# Patient Record
Sex: Female | Born: 1975 | Race: White | Hispanic: No | Marital: Single | Smoking: Never smoker
Health system: Southern US, Community
[De-identification: ages and names within clinical notes are randomized; demographics above are authoritative.]

## PROBLEM LIST (undated history)

## (undated) DIAGNOSIS — F4310 Post-traumatic stress disorder, unspecified: Secondary | ICD-10-CM

## (undated) HISTORY — PX: NO PAST SURGERIES: SHX2092

---

## 2021-03-16 ENCOUNTER — Other Ambulatory Visit: Payer: Self-pay

## 2021-03-16 ENCOUNTER — Ambulatory Visit
Admission: EM | Admit: 2021-03-16 | Discharge: 2021-03-16 | Disposition: A | Discharge status: Home / Self Care | Payer: No Typology Code available for payment source | Attending: Sports Medicine | Admitting: Sports Medicine

## 2021-03-16 DIAGNOSIS — J029 Acute pharyngitis, unspecified: Secondary | ICD-10-CM | POA: Insufficient documentation

## 2021-03-16 DIAGNOSIS — J069 Acute upper respiratory infection, unspecified: Secondary | ICD-10-CM | POA: Insufficient documentation

## 2021-03-16 LAB — GROUP A STREP BY PCR: Group A Strep by PCR: NOT DETECTED

## 2021-03-16 MED ORDER — PROMETHAZINE-DM 6.25-15 MG/5ML PO SYRP: 5.0000 mL | ORAL_SOLUTION | Freq: Four times a day (QID) | ORAL | 0 refills | Status: DC | PRN

## 2021-03-16 MED ORDER — BENZONATATE 100 MG PO CAPS: 200.0000 mg | ORAL_CAPSULE | Freq: Three times a day (TID) | ORAL | 0 refills | Status: DC

## 2021-03-16 MED ORDER — IPRATROPIUM BROMIDE 0.06 % NA SOLN: 2.0000 | Freq: Four times a day (QID) | NASAL | 12 refills | Status: DC

## 2021-03-16 NOTE — ED Triage Notes (Signed)
Patient complains of sore throat that started around 1 week ago and has not improved. Patient states that she is concerned for strep.

## 2021-03-16 NOTE — ED Provider Notes (Signed)
[facility]    CSN: [id number] Arrival date & time: 03/16/21  1602      History   Chief Complaint Chief Complaint  Patient presents with   Sore Throat    HPI Jenny Levy is a 45 y.o. female.   HPI  45 year old female here for evaluation of sore throat.  Patient ports that she has been experiencing a sore throat for the past week along with nasal congestion with dry nasal passages, ear pain, subjective fever last week, headache, productive cough for green to white sputum, and some mild shortness of breath.  She denies any wheezing, body aches, GI complaints or, changes to her sense of taste or smell.  History reviewed. No pertinent past medical history.  There are no problems to display for this patient.   Past Surgical History:  Procedure Laterality Date   NO PAST SURGERIES      OB History   No obstetric history on file.      Home Medications    Prior to Admission medications   Medication Sig Start Date End Date Taking? Authorizing Provider  benzonatate (TESSALON) 100 MG capsule Take 2 capsules (200 mg total) by mouth every 8 (eight) hours. 03/16/21  Yes Becky Augusta, NP  ipratropium (ATROVENT) 0.06 % nasal spray Place 2 sprays into both nostrils 4 (four) times daily. 03/16/21  Yes Becky Augusta, NP  promethazine-dextromethorphan (PROMETHAZINE-DM) 6.25-15 MG/5ML syrup Take 5 mLs by mouth 4 (four) times daily as needed. 03/16/21  Yes Becky Augusta, NP    Family History Family History  Problem Relation Age of Onset   Cancer Father     Social History Social History   Tobacco Use   Smoking status: Never   Smokeless tobacco: Never  Vaping Use   Vaping Use: Never used  Substance Use Topics   Alcohol use: Never   Drug use: Never     Allergies   Amoxicillin   Review of Systems Review of Systems  Constitutional:  Positive for fever. Negative for activity change and appetite change.  HENT:  Positive for congestion, ear pain, sinus pressure  and sore throat. Negative for rhinorrhea.   Respiratory:  Positive for cough and shortness of breath. Negative for wheezing.   Gastrointestinal:  Negative for diarrhea, nausea and vomiting.  Musculoskeletal:  Negative for arthralgias and myalgias.  Skin:  Negative for rash.  Neurological:  Positive for headaches.  Hematological: Negative.   Psychiatric/Behavioral: Negative.      Physical Exam Triage Vital Signs ED Triage Vitals  Enc Vitals Group     BP 03/16/21 1638 (!) 150/99     Pulse Rate 03/16/21 1638 80     Resp 03/16/21 1638 18     Temp 03/16/21 1638 98.5 F (36.9 C)     Temp Source 03/16/21 1638 Oral     SpO2 03/16/21 1638 100 %     Weight 03/16/21 1635 278 lb (126.1 kg)     Height 03/16/21 1635 5\' 11"  (1.803 m)     Head Circumference --      Peak Flow --      Pain Score 03/16/21 1635 8     Pain Loc --      Pain Edu? --      Excl. in GC? --    No data found.  Updated Vital Signs BP (!) 150/99 (BP Location: Right Arm)   Pulse 80   Temp 98.5 F (36.9 C) (Oral)   Resp 18   Ht 5\' 11"  (  1.803 m)   Wt 278 lb (126.1 kg)   SpO2 100%   BMI 38.77 kg/m   Visual Acuity Right Eye Distance:   Left Eye Distance:   Bilateral Distance:    Right Eye Near:   Left Eye Near:    Bilateral Near:     Physical Exam Vitals and nursing note reviewed.  Constitutional:      General: She is not in acute distress.    Appearance: Normal appearance. She is well-developed. She is not ill-appearing.  HENT:     Head: Normocephalic and atraumatic.     Right Ear: Tympanic membrane, ear canal and external ear normal. There is no impacted cerumen.     Left Ear: Tympanic membrane, ear canal and external ear normal. There is no impacted cerumen.     Nose: Congestion present. No rhinorrhea.     Mouth/Throat:     Mouth: Mucous membranes are moist.     Pharynx: Oropharynx is clear. Posterior oropharyngeal erythema present.  Cardiovascular:     Rate and Rhythm: Normal rate and regular  rhythm.     Pulses: Normal pulses.     Heart sounds: Normal heart sounds. No murmur heard.   No gallop.  Pulmonary:     Effort: Pulmonary effort is normal.     Breath sounds: Normal breath sounds. No wheezing, rhonchi or rales.  Musculoskeletal:     Cervical back: Normal range of motion and neck supple.  Lymphadenopathy:     Cervical: No cervical adenopathy.  Skin:    General: Skin is warm and dry.     Capillary Refill: Capillary refill takes less than 2 seconds.     Findings: No erythema or rash.  Neurological:     General: No focal deficit present.     Mental Status: She is alert and oriented to person, place, and time.  Psychiatric:        Mood and Affect: Mood normal.        Behavior: Behavior normal.        Thought Content: Thought content normal.        Judgment: Judgment normal.     UC Treatments / Results  Labs (all labs ordered are listed, but only abnormal results are displayed) Labs Reviewed  GROUP A STREP BY PCR    EKG   Radiology No results found.  Procedures Procedures (including critical care time)  Medications Ordered in UC Medications - No data to display  Initial Impression / Assessment and Plan / UC Course  I have reviewed the triage vital signs and the nursing notes.  Pertinent labs & imaging results that were available during my care of the patient were reviewed by me and considered in my medical decision making (see chart for details).  Patient is a very pleasant, nontoxic-appearing 45 year old female here for evaluation of sore throat and respiratory symptoms that have been going on for the past week as outlined in HPI above.  Patient's physical exam reveals pearly gray tympanic membranes bilaterally with normal light reflex and mildly ceruminous external auditory canals.  Nasal mucosa is erythematous and edematous without nasal discharge.  Oropharyngeal exam reveals recessed tonsillar pillars without edema, erythema, or exudate.  Posterior  oropharynx is erythematous and injected with clear postnasal drip.  No cervical lymphadenopathy appreciated exam.  Cardiopulmonary exam is benign.  Strep PCR was collected at triage.  Strep PCR is negative.  Will discharge patient home with a diagnosis of URI.  Will provide Atrovent nasal spray to  help with nasal congestion, have patient take Tylenol and ibuprofen as needed for pain, and perform salt water gargles.  We will also give Tessalon Perles and Promethazine DM cough syrup to help with cough.  Final Clinical Impressions(s) / UC Diagnoses   Final diagnoses:  Viral URI with cough  Pharyngitis, unspecified etiology     Discharge Instructions      Use the Atrovent nasal spray, 2 squirts in each nostril every 6 hours, as needed for runny nose and postnasal drip.  Use the Tessalon Perles every 8 hours during the day.  Take them with a small sip of water.  They may give you some numbness to the base of your tongue or a metallic taste in your mouth, this is normal.  Use the Promethazine DM cough syrup at bedtime for cough and congestion.  It will make you drowsy so do not take it during the day.  Gargle with warm salt water 2-3 times a day to soothe your throat and aid in pain relief.  Return for reevaluation or see your primary care provider for any new or worsening symptoms.      ED Prescriptions     Medication Sig Dispense Auth. Provider   ipratropium (ATROVENT) 0.06 % nasal spray Place 2 sprays into both nostrils 4 (four) times daily. 15 mL Becky Augusta, NP   benzonatate (TESSALON) 100 MG capsule Take 2 capsules (200 mg total) by mouth every 8 (eight) hours. 21 capsule Becky Augusta, NP   promethazine-dextromethorphan (PROMETHAZINE-DM) 6.25-15 MG/5ML syrup Take 5 mLs by mouth 4 (four) times daily as needed. 118 mL Becky Augusta, NP      PDMP not reviewed this encounter.   Becky Augusta, NP 03/16/21 1725

## 2021-03-16 NOTE — Discharge Instructions (Addendum)
Use the Atrovent nasal spray, 2 squirts in each nostril every 6 hours, as needed for runny nose and postnasal drip.  Use the Tessalon Perles every 8 hours during the day.  Take them with a small sip of water.  They may give you some numbness to the base of your tongue or a metallic taste in your mouth, this is normal.  Use the Promethazine DM cough syrup at bedtime for cough and congestion.  It will make you drowsy so do not take it during the day.  Gargle with warm salt water 2-3 times a day to soothe your throat and aid in pain relief.  Return for reevaluation or see your primary care provider for any new or worsening symptoms.

## 2021-03-23 ENCOUNTER — Other Ambulatory Visit: Payer: Self-pay

## 2021-03-23 ENCOUNTER — Ambulatory Visit (INDEPENDENT_AMBULATORY_CARE_PROVIDER_SITE_OTHER): Payer: Non-veteran care

## 2021-03-23 ENCOUNTER — Ambulatory Visit
Admission: EM | Admit: 2021-03-23 | Discharge: 2021-03-23 | Disposition: A | Discharge status: Home / Self Care | Payer: Non-veteran care | Attending: Family Medicine | Admitting: Family Medicine

## 2021-03-23 DIAGNOSIS — M79672 Pain in left foot: Secondary | ICD-10-CM

## 2021-03-23 DIAGNOSIS — S93692A Other sprain of left foot, initial encounter: Secondary | ICD-10-CM

## 2021-03-23 HISTORY — DX: Post-traumatic stress disorder, unspecified: F43.10

## 2021-03-23 MED ORDER — MELOXICAM 15 MG PO TABS: 15.0000 mg | ORAL_TABLET | Freq: Every day | ORAL | 0 refills | Status: DC | PRN

## 2021-03-23 NOTE — ED Triage Notes (Signed)
Pt carrying 270lb yoke during training and felt L foot pop in heel area.  Swelling noted in left ankle.  Pt identifes bottom of heel and inner heel area as most painful.  Difficulty ambulating.  Took Ibuprofen and Zanaflex today.

## 2021-03-23 NOTE — Discharge Instructions (Addendum)
Rest, ice, elevation.  Medication as directed.  I recommend follow up with my colleague in [place].  Take care  Dr. Adriana Simas

## 2021-03-25 NOTE — ED Provider Notes (Signed)
[facility]    CSN: [id number] Arrival date & time: 03/23/21  1910      History   Chief Complaint Foot injury  HPI  45 year old female presents with the above complaint.  Patient reports that she was walking while carrying a 270 pound yolk during training.  She states that she felt a pop in her left heel.  She localizes the pain to the plantar aspect of the heel.  She states that she does not feel like she has injured her achilles.  She reports swelling of the heel.  She states that she is having difficulty ambulating.  Pain 8/10 in severity.  She has taken ibuprofen and Zanaflex without improvement.  No other injuries.  Denies ankle pain.  No other complaints this time.  Past Medical History:  Diagnosis Date   PTSD (post-traumatic stress disorder)    Past Surgical History:  Procedure Laterality Date   NO PAST SURGERIES     OB History   No obstetric history on file.    Home Medications    Prior to Admission medications   Medication Sig Start Date End Date Taking? Authorizing Provider  amitriptyline (ELAVIL) 10 MG tablet Take 10 mg by mouth at bedtime.   Yes [provider]  meloxicam (MOBIC) 15 MG tablet Take 1 tablet (15 mg total) by mouth daily as needed for pain. 03/23/21  Yes Tayjon Halladay G, DO  tiZANidine (ZANAFLEX) 4 MG capsule Take 4 mg by mouth 3 (three) times daily.   Yes [provider]    Family History Family History  Problem Relation Age of Onset   Cancer Father     Social History Social History   Tobacco Use   Smoking status: Never   Smokeless tobacco: Never  Vaping Use   Vaping Use: Never used  Substance Use Topics   Alcohol use: Never   Drug use: Never     Allergies   Amoxicillin and Penicillins   Review of Systems Review of Systems Per HPI  Physical Exam Triage Vital Signs ED Triage Vitals  Enc Vitals Group     BP 03/23/21 1950 (!) 142/96     Pulse Rate 03/23/21 1950 84     Resp 03/23/21 1950 18      Temp 03/23/21 1950 98.7 F (37.1 C)     Temp Source 03/23/21 1950 Oral     SpO2 03/23/21 1950 100 %     Weight --      Height --      Head Circumference --      Peak Flow --      Pain Score 03/23/21 1942 8     Pain Loc --      Pain Edu? --      Excl. in GC? --    Updated Vital Signs BP (!) 142/96 (BP Location: Right Arm)   Pulse 84   Temp 98.7 F (37.1 C) (Oral)   Resp 18   SpO2 100%   Visual Acuity Right Eye Distance:   Left Eye Distance:   Bilateral Distance:    Right Eye Near:   Left Eye Near:    Bilateral Near:     Physical Exam Constitutional:      General: She is not in acute distress.    Appearance: Normal appearance. She is obese. She is not ill-appearing.  HENT:     Head: Normocephalic and atraumatic.  Eyes:     General:  Right eye: No discharge.        Left eye: No discharge.     Conjunctiva/sclera: Conjunctivae normal.  Pulmonary:     Effort: Pulmonary effort is normal. No respiratory distress.  Musculoskeletal:     Comments: Left foot -no palpable defect or tenderness over the Achilles.  She is exquisitely tender on the plantar aspect of the heel.  Neurological:     Mental Status: She is alert.  Psychiatric:        Mood and Affect: Mood normal.        Behavior: Behavior normal.     UC Treatments / Results  Labs (all labs ordered are listed, but only abnormal results are displayed) Labs Reviewed - No data to display  EKG   Radiology DG Os Calcis Left  Result Date: 03/23/2021 CLINICAL DATA:  Heel pain following training, initial encounter EXAM: LEFT OS CALCIS - 2+ VIEW COMPARISON:  None. FINDINGS: Calcaneal spur is noted. No acute fracture is seen. Increased soft tissue edema is noted surrounding the musculotendinous junction of the Achilles suspicious for underlying injury. IMPRESSION: No acute bony abnormality is noted. Changes suspicious for Achilles injury. Correlate with clinical findings. Electronically Signed   By: Alcide Clever  M.D.   On: 03/23/2021 20:28    Procedures Procedures (including critical care time)  Medications Ordered in UC Medications - No data to display  Initial Impression / Assessment and Plan / UC Course  I have reviewed the triage vital signs and the nursing notes.  Pertinent labs & imaging results that were available during my care of the patient were reviewed by me and considered in my medical decision making (see chart for details).    45 year old female presents with suspected injury of the plantar fascia.  X-ray, os calcis obtained today.  Independently reviewed by me.  Interpretation: No appreciable fracture.  There is some soft tissue edema around the junction of the Achilles and the calcaneus.  Possible Achilles injury as well based off of imaging.  She does not have any tenderness or appreciable defects of the Achilles.  Meloxicam as directed.  Placed in a cam walker.  Advised to follow-up with sports medicine or orthopedics.  Patient declined crutches today.  Final Clinical Impressions(s) / UC Diagnoses   Final diagnoses:  Traumatic rupture of plantar fascia of left foot, initial encounter     Discharge Instructions      Rest, ice, elevation.  Medication as directed.  I recommend follow up with my colleague in [place].  Take care  Dr. Adriana Simas    ED Prescriptions     Medication Sig Dispense Auth. Provider   meloxicam (MOBIC) 15 MG tablet Take 1 tablet (15 mg total) by mouth daily as needed for pain. 30 tablet Tommie Sams, DO      PDMP not reviewed this encounter.   Tommie Sams, [place] 03/25/21 [phone]

## 2021-12-01 ENCOUNTER — Ambulatory Visit
Admission: EM | Admit: 2021-12-01 | Discharge: 2021-12-01 | Disposition: A | Discharge status: Home / Self Care | Payer: Non-veteran care | Attending: Emergency Medicine | Admitting: Emergency Medicine

## 2021-12-01 ENCOUNTER — Other Ambulatory Visit: Payer: Self-pay

## 2021-12-01 DIAGNOSIS — J069 Acute upper respiratory infection, unspecified: Secondary | ICD-10-CM

## 2021-12-01 MED ORDER — IPRATROPIUM BROMIDE 0.06 % NA SOLN: 2.0000 | Freq: Four times a day (QID) | NASAL | 12 refills | Status: DC

## 2021-12-01 MED ORDER — PROMETHAZINE-DM 6.25-15 MG/5ML PO SYRP: 5.0000 mL | ORAL_SOLUTION | Freq: Four times a day (QID) | ORAL | 0 refills | Status: DC | PRN

## 2021-12-01 MED ORDER — BENZONATATE 100 MG PO CAPS: 200.0000 mg | ORAL_CAPSULE | Freq: Three times a day (TID) | ORAL | 0 refills | Status: DC

## 2021-12-01 NOTE — ED Provider Notes (Signed)
?[facility] ? ? ? ?CSN: [id number] ?Arrival date & time: 12/01/21  0805 ? ? ?  ? ?History   ?Chief Complaint ?Chief Complaint  ?Patient presents with  ? Nasal Congestion  ? ? ?HPI ?Jenny Levy is a 46 y.o. female.  ? ?HPI ? ?46 year old female here for evaluation of respiratory complaints. ? ?Patient ports that she has been experiencing sinus pressure with clear and occasional red nasal discharge for the last 4 days.  She developed a sore throat yesterday and also a nonproductive cough.  She woke up this morning with swollen lymph nodes so she came in for evaluation.  She denies fever, sweats or chills, ear pain, shortness of breath or wheezing, or GI complaints. ? ?Past Medical History:  ?Diagnosis Date  ? PTSD (post-traumatic stress disorder)   ? ? ?There are no problems to display for this patient. ? ? ?Past Surgical History:  ?Procedure Laterality Date  ? NO PAST SURGERIES    ? ? ?OB History   ?No obstetric history on file. ?  ? ? ? ?Home Medications   ? ?Prior to Admission medications   ?Medication Sig Start Date End Date Taking? Authorizing Provider  ?amitriptyline (ELAVIL) 10 MG tablet Take 10 mg by mouth at bedtime.   Yes [provider]  ?benzonatate (TESSALON) 100 MG capsule Take 2 capsules (200 mg total) by mouth every 8 (eight) hours. 12/01/21  Yes Margarette Canada, NP  ?ipratropium (ATROVENT) 0.06 % nasal spray Place 2 sprays into both nostrils 4 (four) times daily. 12/01/21  Yes Margarette Canada, NP  ?promethazine-dextromethorphan (PROMETHAZINE-DM) 6.25-15 MG/5ML syrup Take 5 mLs by mouth 4 (four) times daily as needed. 12/01/21  Yes Margarette Canada, NP  ?tiZANidine (ZANAFLEX) 4 MG capsule Take 4 mg by mouth 3 (three) times daily.   Yes [provider]  ? ? ?Family History ?Family History  ?Problem Relation Age of Onset  ? Cancer Father   ? ? ?Social History ?Social History  ? ?Tobacco Use  ? Smoking status: Never  ? Smokeless tobacco: Never  ?Vaping Use  ? Vaping Use: Never used   ?Substance Use Topics  ? Alcohol use: Never  ? Drug use: Never  ? ? ? ?Allergies   ?Amoxicillin and Penicillins ? ? ?Review of Systems ?Review of Systems  ?Constitutional:  Negative for fever.  ?HENT:  Positive for congestion, rhinorrhea, sinus pressure and sore throat. Negative for ear pain.   ?Respiratory:  Positive for cough. Negative for shortness of breath and wheezing.   ?Gastrointestinal:  Negative for diarrhea, nausea and vomiting.  ?Musculoskeletal:  Negative for arthralgias and neck pain.  ?Skin:  Negative for rash.  ?Hematological: Negative.   ?Psychiatric/Behavioral: Negative.    ? ? ?Physical Exam ?Triage Vital Signs ?ED Triage Vitals  ?Enc Vitals Group  ?   BP 12/01/21 0819 (!) 145/99  ?   Pulse Rate 12/01/21 0819 91  ?   Resp 12/01/21 0819 18  ?   Temp 12/01/21 0819 99.4 ?F (37.4 ?C)  ?   Temp Source 12/01/21 0819 Oral  ?   SpO2 12/01/21 0819 98 %  ?   Weight 12/01/21 0819 278 lb (126.1 kg)  ?   Height 12/01/21 0819 5\' 11"  (1.803 m)  ?   Head Circumference --   ?   Peak Flow --   ?   Pain Score 12/01/21 0818 5  ?   Pain Loc --   ?   Pain Edu? --   ?  Excl. in [place]? --   ? ?No data found. ? ?Updated Vital Signs ?BP (!) 145/99 (BP Location: Right Arm)   Pulse 91   Temp 99.4 ?F (37.4 ?C) (Oral)   Resp 18   Ht 5\' 11"  (1.803 m)   Wt 278 lb (126.1 kg)   SpO2 98%   BMI 38.77 kg/m?  ? ?Visual Acuity ?Right Eye Distance:   ?Left Eye Distance:   ?Bilateral Distance:   ? ?Right Eye Near:   ?Left Eye Near:    ?Bilateral Near:    ? ?Physical Exam ?Vitals reviewed.  ?Constitutional:   ?   Appearance: Normal appearance. She is not ill-appearing.  ?HENT:  ?   Head: Normocephalic and atraumatic.  ?   Right Ear: Tympanic membrane, ear canal and external ear normal. There is no impacted cerumen.  ?   Left Ear: Tympanic membrane, ear canal and external ear normal. There is no impacted cerumen.  ?   Nose: Congestion and rhinorrhea present.  ?   Mouth/Throat:  ?   Mouth: Mucous membranes are moist.  ?   Pharynx:  Oropharynx is clear. Posterior oropharyngeal erythema present. No oropharyngeal exudate.  ?Cardiovascular:  ?   Rate and Rhythm: Normal rate and regular rhythm.  ?   Pulses: Normal pulses.  ?   Heart sounds: Normal heart sounds. No murmur heard. ?  No friction rub. No gallop.  ?Pulmonary:  ?   Effort: Pulmonary effort is normal.  ?   Breath sounds: Normal breath sounds. No wheezing, rhonchi or rales.  ?Musculoskeletal:  ?   Cervical back: Normal range of motion and neck supple.  ?Lymphadenopathy:  ?   Cervical: Cervical adenopathy present.  ?Skin: ?   General: Skin is warm and dry.  ?   Capillary Refill: Capillary refill takes less than 2 seconds.  ?   Findings: No erythema or rash.  ?Neurological:  ?   General: No focal deficit present.  ?   Mental Status: She is oriented to person, place, and time.  ?Psychiatric:     ?   Mood and Affect: Mood normal.     ?   Behavior: Behavior normal.     ?   Thought Content: Thought content normal.     ?   Judgment: Judgment normal.  ? ? ? ?UC Treatments / Results  ?Labs ?(all labs ordered are listed, but only abnormal results are displayed) ?Labs Reviewed - No data to display ? ?EKG ? ? ?Radiology ?No results found. ? ?Procedures ?Procedures (including critical care time) ? ?Medications Ordered in UC ?Medications - No data to display ? ?Initial Impression / Assessment and Plan / UC Course  ?I have reviewed the triage vital signs and the nursing notes. ? ?Pertinent labs & imaging results that were available during my care of the patient were reviewed by me and considered in my medical decision making (see chart for details). ? ?Patient is a nontoxic-appearing 46 year old female here for evaluation of respiratory complaints as outlined in HPI above.  On exam patient has pearly-gray tympanic membranes bilaterally with normal light reflex and clear external auditory canals.  Nasal mucosa is erythematous and edematous with copious clear discharge in both nares.  Oropharyngeal exam  reveals mild posterior oropharyngeal erythema and clear postnasal drip.  No exudate noted.  Patient does have bilateral anterior cervical lymphadenopathy on exam.  Cardiopulmonary exam feels clung sounds in all fields.  Patient exam is consistent with a viral URI with a cough and  I will treat her with Atrovent nasal spray to help with the nasal congestion and postnasal drip, Tessalon Perles help with cough for the daytime, and Promethazine DM cough syrup at nighttime.  I advised her that if her symptoms continue longer than 10 days or she develops fever we can reexam and the need for an antibiotic but I do not see one at this time.  Patient denies need for work note. ? ? ?Final Clinical Impressions(s) / UC Diagnoses  ? ?Final diagnoses:  ?Viral URI with cough  ? ? ? ?Discharge Instructions   ? ?  ?Use the Atrovent nasal spray, 2 squirts in each nostril every 6 hours, as needed for runny nose and postnasal drip. ? ?Use the Tessalon Perles every 8 hours during the day.  Take them with a small sip of water.  They may give you some numbness to the base of your tongue or a metallic taste in your mouth, this is normal. ? ?Use the Promethazine DM cough syrup at bedtime for cough and congestion.  It will make you drowsy so do not take it during the day. ? ?Return for reevaluation or see your primary care provider for any new or worsening symptoms.  ? ? ? ? ?ED Prescriptions   ? ? Medication Sig Dispense Auth. Provider  ? benzonatate (TESSALON) 100 MG capsule Take 2 capsules (200 mg total) by mouth every 8 (eight) hours. 21 capsule Margarette Canada, NP  ? ipratropium (ATROVENT) 0.06 % nasal spray Place 2 sprays into both nostrils 4 (four) times daily. 15 mL Margarette Canada, NP  ? promethazine-dextromethorphan (PROMETHAZINE-DM) 6.25-15 MG/5ML syrup Take 5 mLs by mouth 4 (four) times daily as needed. 118 mL Margarette Canada, NP  ? ?  ? ?PDMP not reviewed this encounter. ?  ?Margarette Canada, NP ?12/01/21 [phone] ? ?

## 2021-12-01 NOTE — Discharge Instructions (Addendum)

## 2021-12-01 NOTE — ED Triage Notes (Signed)
Pt here with C/O sinus pressure for 4 days, throat started getting sore yesterday, woke up with lymph nodes swollen.  ?

## 2021-12-28 ENCOUNTER — Ambulatory Visit
Admission: EM | Admit: 2021-12-28 | Discharge: 2021-12-28 | Disposition: A | Discharge status: Home / Self Care | Payer: Non-veteran care | Attending: Internal Medicine | Admitting: Internal Medicine

## 2021-12-28 ENCOUNTER — Encounter: Payer: Self-pay | Admitting: Emergency Medicine

## 2021-12-28 ENCOUNTER — Other Ambulatory Visit: Payer: Self-pay

## 2021-12-28 DIAGNOSIS — K529 Noninfective gastroenteritis and colitis, unspecified: Secondary | ICD-10-CM

## 2021-12-28 MED ORDER — FAMOTIDINE 20 MG PO TABS: 20.0000 mg | ORAL_TABLET | Freq: Two times a day (BID) | ORAL | 0 refills | Status: AC

## 2021-12-28 MED ORDER — ONDANSETRON HCL 4 MG PO TABS: 8.0000 mg | ORAL_TABLET | ORAL | 0 refills | Status: AC | PRN

## 2021-12-28 NOTE — Discharge Instructions (Signed)
Symptoms and exam today are consistent with a viral stomach bug.  Prescriptions for famotidine (for heartburn) and ondansetron (for nausea) were sent to the pharmacy.  Anticipate gradual improvement over the next few days; may take a couple weeks for bowel movements to return to normal.  Diet as tolerated.  Push fluids.  Rest.  Recheck or followup with your primary care provider for new fever >100.5, severe/sustained abdominal pain, persistent vomiting, or if not improving as expected. ?

## 2021-12-28 NOTE — ED Provider Notes (Signed)
?[facility] ? ? ? ?CSN: [id number] ?Arrival date & time: 12/28/21  1439 ? ? ?  ? ?History   ?Chief Complaint ?Chief Complaint  ?Patient presents with  ? Abdominal Pain  ? ? ?HPI ?Danah Reinecke is a 46 y.o. female. Sunday am 3/26 woke up with bad epigastric burning/crampy discomfort, like heartburn but she never gets heartburn.  Had onset of nausea after that and little bit of vomiting; last emesis was yesterday 3/27.  Having soft stools at markedly increased frequency from usual also.  No fever.  Not coughing, no runny/congested nose, no sore throat.  No unusual vaginal discharge or bleeding; little bit of spotting like she does for her period, which is due.  No urinary discomfort, no frequency.  Not lightheaded.  Keeping liquids down.  A close girlfriend had similar symptoms all week last week; they spent time together the weekend before last.   ? ? ?Abdominal Pain ? ?Past Medical History:  ?Diagnosis Date  ? PTSD (post-traumatic stress disorder)   ? ? ?There are no problems to display for this patient. ? ? ?Past Surgical History:  ?Procedure Laterality Date  ? NO PAST SURGERIES    ? ? ?Home Medications   ? ?Prior to Admission medications   ?Medication Sig Start Date End Date Taking? Authorizing Provider  ?famotidine (PEPCID) 20 MG tablet Take 1 tablet (20 mg total) by mouth 2 (two) times daily. 12/28/21  Yes Isa Rankin, MD  ?ondansetron [facility]) 4 MG tablet Take 2 tablets (8 mg total) by mouth every 4 (four) hours as needed for nausea or vomiting. 12/28/21  Yes Isa Rankin, MD  ?amitriptyline (ELAVIL) 10 MG tablet Take 10 mg by mouth at bedtime.    [provider]  ?tiZANidine (ZANAFLEX) 4 MG capsule Take 4 mg by mouth 3 (three) times daily.    [provider]  ? ? ?Family History ?Family History  ?Problem Relation Age of Onset  ? Cancer Father   ? ? ?Social History ?Social History  ? ?Tobacco Use  ? Smoking status: Never  ? Smokeless tobacco: Never  ?Vaping Use  ?  Vaping Use: Never used  ?Substance Use Topics  ? Alcohol use: Never  ? Drug use: Never  ? ? ? ?Allergies   ?Amoxicillin and Penicillins ? ? ?Review of Systems ?Review of Systems  ?Gastrointestinal:  Positive for abdominal pain.  Epigastric; see also HPI ? ? ?Physical Exam ?Triage Vital Signs ?ED Triage Vitals  ?Enc Vitals Group  ?   BP 12/28/21 1454 (!) 138/97  ?   Pulse Rate 12/28/21 1454 77  ?   Resp 12/28/21 1454 18  ?   Temp 12/28/21 1454 98.6 ?F (37 ?C)  ?   Temp Source 12/28/21 1454 Oral  ?   SpO2 12/28/21 1454 98 %  ?   Weight 12/28/21 1452 278 lb (126.1 kg)  ?   Height 12/28/21 1452 5\' 11"  (1.803 m)  ?   Pain Score 12/28/21 1451 8  ?   Pain Loc --   ? ? ?Updated Vital Signs ?BP (!) 138/97 (BP Location: Right Arm)   Pulse 77   Temp 98.6 ?F (37 ?C) (Oral)   Resp 18   Ht 5\' 11"  (1.803 m)   Wt 126.1 kg   SpO2 98%   BMI 38.77 kg/m?  ? ?Physical Exam ?Constitutional:   ?   General: She is not in acute distress. ?   Appearance: She is ill-appearing. She is not  toxic-appearing.  ?   Comments: Good hygiene  ?HENT:  ?   Head: Atraumatic.  ?   Mouth/Throat:  ?   Mouth: Mucous membranes are moist.  ?Eyes:  ?   Conjunctiva/sclera:  ?   Right eye: Right conjunctiva is not injected. No exudate. ?   Left eye: Left conjunctiva is not injected. No exudate. ?   Comments: Conjugate gaze observed; eyes a little watery but no frank discharge  ?Cardiovascular:  ?   Rate and Rhythm: Normal rate and regular rhythm.  ?Pulmonary:  ?   Effort: Pulmonary effort is normal. No respiratory distress.  ?   Breath sounds: No wheezing or rhonchi.  ?Abdominal:  ?   General: There is no distension.  ?   Palpations: Abdomen is soft.  ?   Tenderness: There is no guarding or rebound.  ?   Comments: Moderate tenderness to deep palpation in the epigastrium  ?Musculoskeletal:  ?   Cervical back: Neck supple.  ?   Comments: Walked into the urgent care independently  ?Skin: ?   General: Skin is warm and dry.  ?   Comments: No cyanosis   ?Neurological:  ?   Mental Status: She is alert.  ?   Comments: Face symmetric, speech clear/coherent/logical  ? ? ? ?UC Treatments / Results  ?Labs ?(all labs ordered are listed, but only abnormal results are displayed) ?Labs Reviewed - No data to display ?NA ? ?EKG ?NA ? ?Radiology ?No results found. ?NA ? ?Procedures ?Procedures (including critical care time) ?NA ? ?Medications Ordered in UC ?Medications - No data to display ?NA ? ?Final Clinical Impressions(s) / UC Diagnoses  ? ?Final diagnoses:  ?Acute gastroenteritis  ? ? ? ?Discharge Instructions   ? ?  ?Symptoms and exam today are consistent with a viral stomach bug.  Prescriptions for famotidine (for heartburn) and ondansetron (for nausea) were sent to the pharmacy.  Anticipate gradual improvement over the next few days; may take a couple weeks for bowel movements to return to normal.  Diet as tolerated.  Push fluids.  Rest.  Recheck or followup with your primary care provider for new fever >100.5, severe/sustained abdominal pain, persistent vomiting, or if not improving as expected. ? ? ?ED Prescriptions   ? ? Medication Sig Dispense Auth. Provider  ? ondansetron (ZOFRAN) 4 MG tablet Take 2 tablets (8 mg total) by mouth every 4 (four) hours as needed for nausea or vomiting. 20 tablet Isa Rankin, MD  ? famotidine (PEPCID) 20 MG tablet Take 1 tablet (20 mg total) by mouth 2 (two) times daily. 30 tablet Isa Rankin, MD  ? ?  ? ?PDMP not reviewed this encounter. ?  ?Isa Rankin, MD ?12/30/21 1107 ? ?

## 2021-12-28 NOTE — ED Triage Notes (Signed)
Pt c/o nausea, epigastric pain, vomiting and headache. Started 2 days ago. She states vomited last yesterday. Denies fever. She has tried tums, pepto without relief. Abdominal pain is intermittent. She states it feels like her stomach muscle cramps. ?

## 2022-04-29 IMAGING — CR DG OS CALCIS 2+V*L*
3 series · 3 of 3 positions shown · non-contrast
Comparison: None.

CLINICAL DATA: Heel pain following training, initial encounter

EXAM:
LEFT OS CALCIS - 2+ VIEW

[calcaneus axial (1 of 2)]
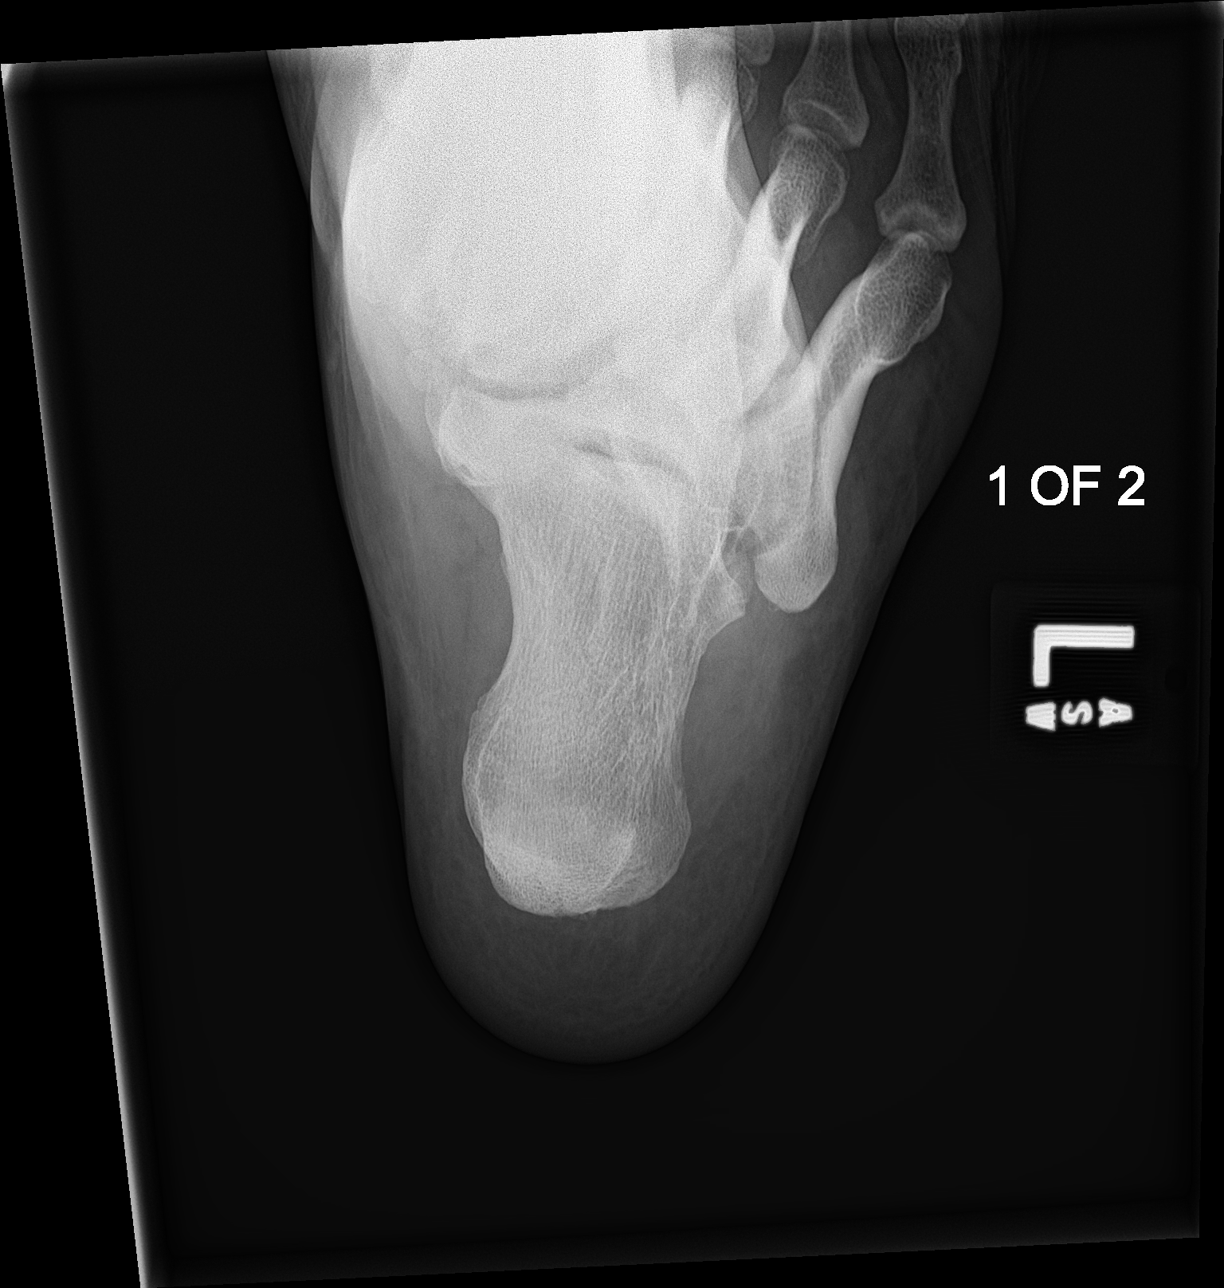

[calcaneus axial (2 of 2)]
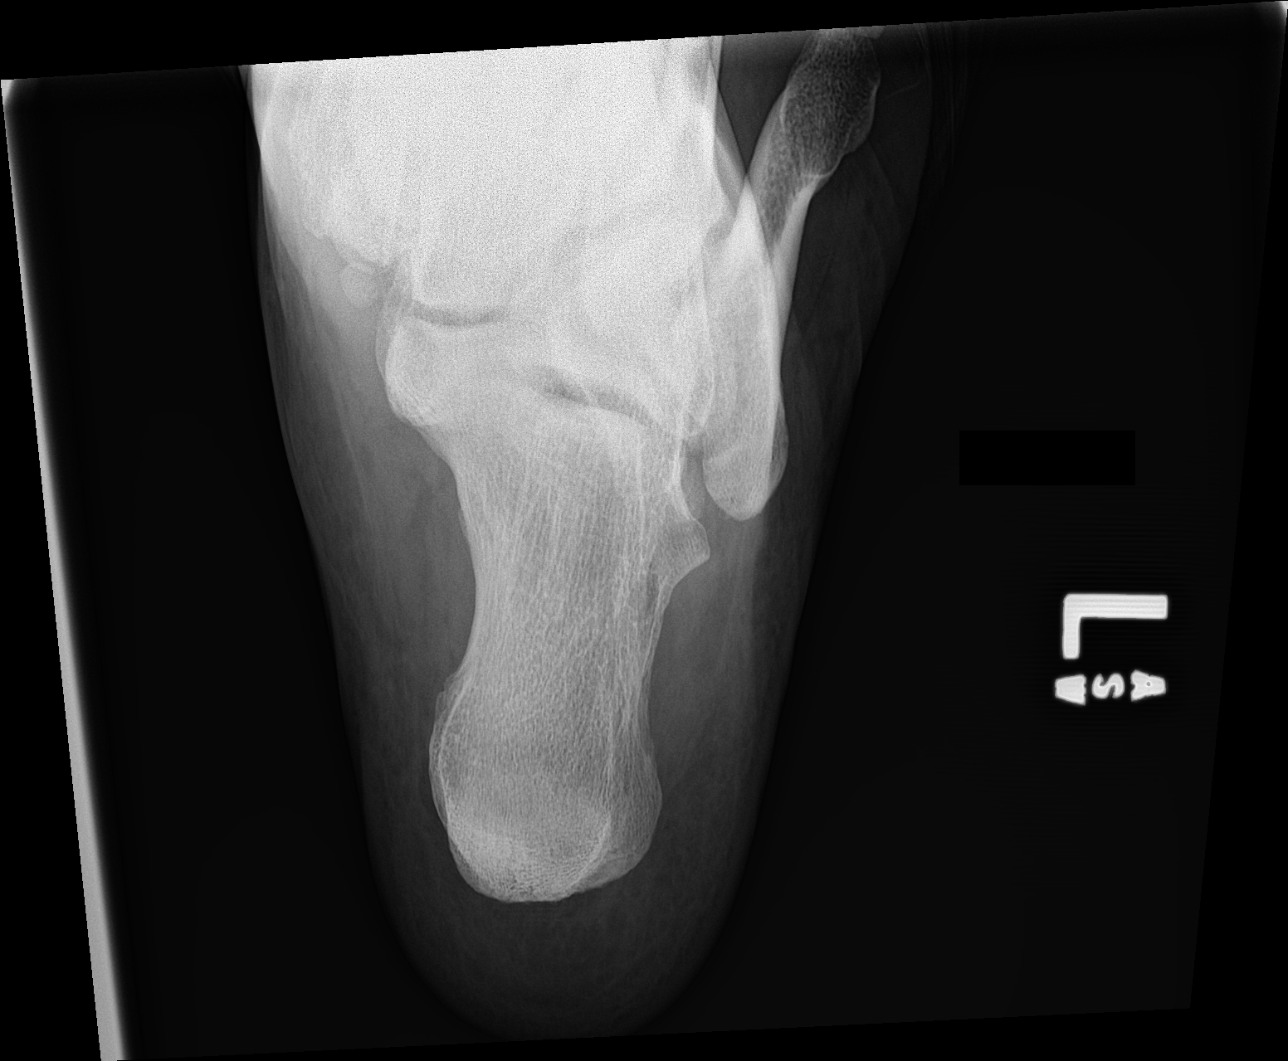

[calcaneus lat]
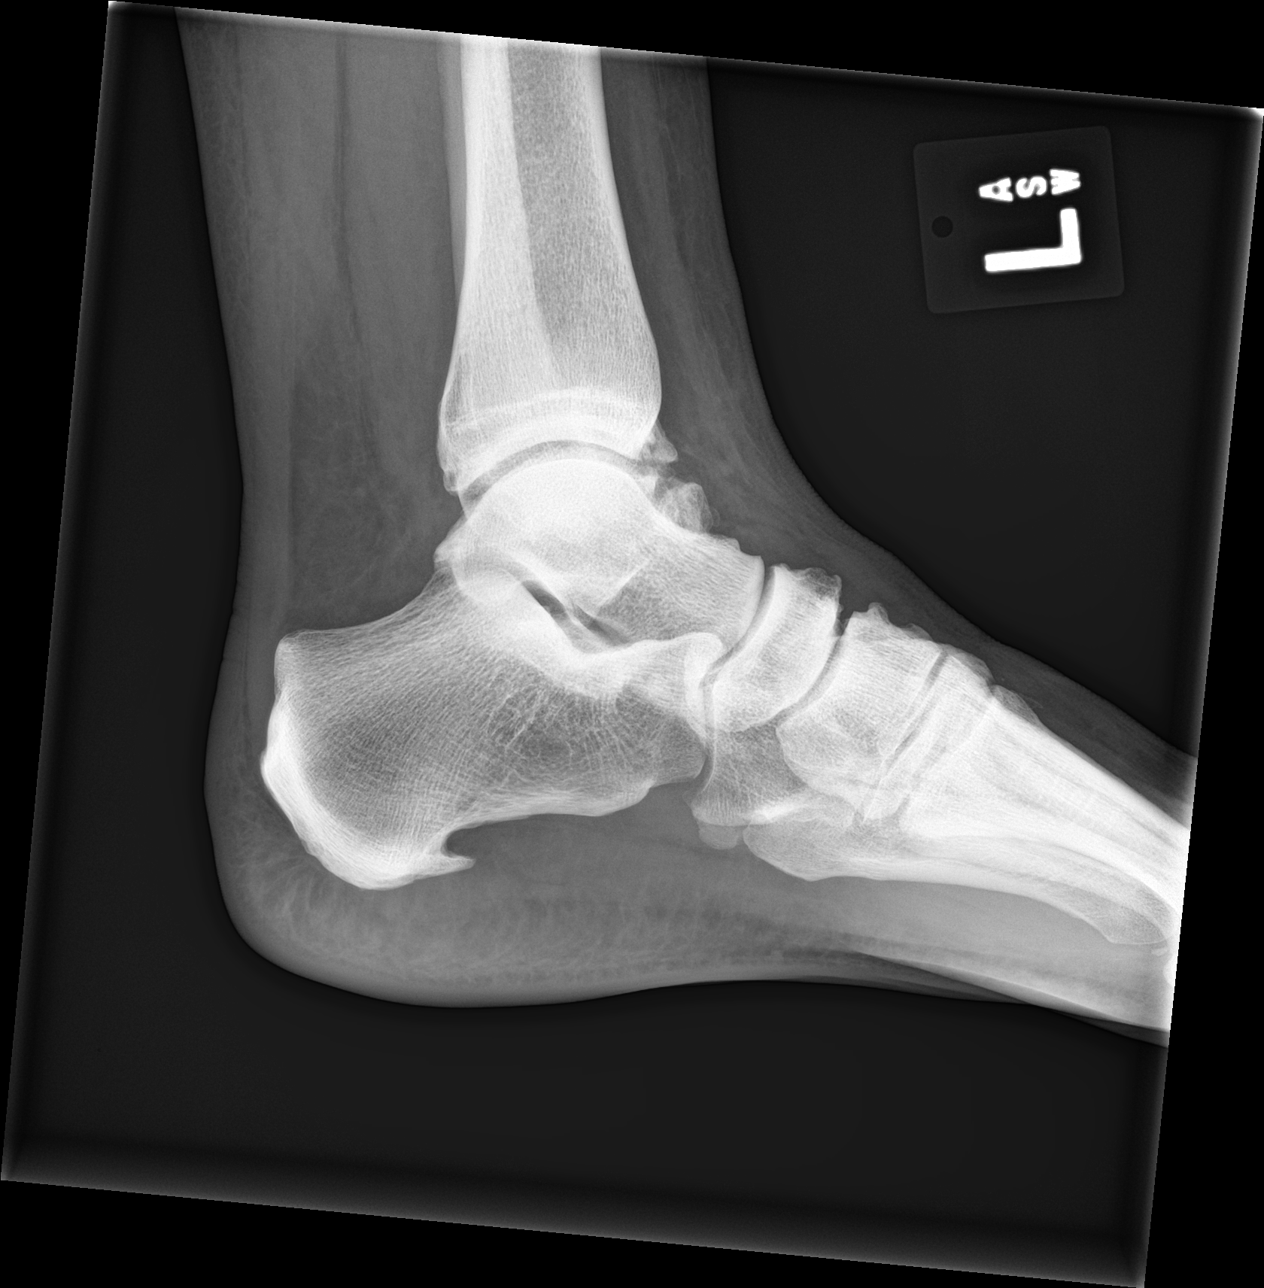

[3 of 3 positions shown; findings below may reference images not displayed]

FINDINGS: Calcaneal spur is noted. No acute fracture is seen. Increased soft
tissue edema is noted surrounding the musculotendinous junction of
the Achilles suspicious for underlying injury.
IMPRESSION: No acute bony abnormality is noted.

Changes suspicious for Achilles injury. Correlate with clinical
findings.
